# Patient Record
Sex: Female | Born: 1995 | Race: White | Hispanic: No | Marital: Single
Health system: Southern US, Community
[De-identification: ages and names within clinical notes are randomized; demographics above are authoritative.]

---

## 2020-07-18 ENCOUNTER — Emergency Department (HOSPITAL_COMMUNITY)
Admission: EM | Admit: 2020-07-18 | Discharge: 2020-07-18 | Disposition: A | Discharge status: Home / Self Care | Payer: BC Managed Care – PPO | Attending: Emergency Medicine | Admitting: Emergency Medicine

## 2020-07-18 ENCOUNTER — Emergency Department (HOSPITAL_COMMUNITY): Payer: BC Managed Care – PPO

## 2020-07-18 ENCOUNTER — Other Ambulatory Visit: Payer: Self-pay

## 2020-07-18 DIAGNOSIS — Y9301 Activity, walking, marching and hiking: Secondary | ICD-10-CM | POA: Insufficient documentation

## 2020-07-18 DIAGNOSIS — S99912A Unspecified injury of left ankle, initial encounter: Secondary | ICD-10-CM | POA: Diagnosis present

## 2020-07-18 DIAGNOSIS — S8262XA Displaced fracture of lateral malleolus of left fibula, initial encounter for closed fracture: Secondary | ICD-10-CM | POA: Diagnosis not present

## 2020-07-18 DIAGNOSIS — S80211A Abrasion, right knee, initial encounter: Secondary | ICD-10-CM | POA: Insufficient documentation

## 2020-07-18 DIAGNOSIS — X501XXA Overexertion from prolonged static or awkward postures, initial encounter: Secondary | ICD-10-CM | POA: Diagnosis not present

## 2020-07-18 DIAGNOSIS — Y92219 Unspecified school as the place of occurrence of the external cause: Secondary | ICD-10-CM | POA: Diagnosis not present

## 2020-07-18 NOTE — ED Provider Notes (Signed)
[facility] EMERGENCY DEPARTMENT Provider Note   CSN: [id number] Arrival date & time: 07/18/20  1252     History Chief Complaint  Patient presents with  . Ankle Pain    Sarah Dickson is a 25 y.o. female presents today for evaluation of left ankle injury.  Patient was walking to class today down a slight hill when she rolled her ankle, inversion injury.  Patient reports pain began immediately after her injury, moderate intensity aching nonradiating worsened with ambulation improved with rest.  Patient was able to walk to her class to take her exam and return home she then grabbed some crutches and came to the ER for evaluation.  Patient has noticed swelling and bruising of her left lateral ankle since that time.  Denies blood thinner use, head injury, loss conscious, fever/chills, numbness/tingling, weakness or any additional concerns.  HPI     No past medical history on file.  There are no problems to display for this patient.      OB History   No obstetric history on file.     No family history on file.     Home Medications Prior to Admission medications   Not on File    Allergies    Patient has no allergy information on record.  Review of Systems   Review of Systems  Constitutional: Negative.  Negative for chills and fever.  Cardiovascular: Negative.  Negative for chest pain.  Musculoskeletal: Positive for arthralgias and joint swelling.  Skin: Negative.  Negative for wound.  Neurological: Negative.  Negative for syncope, weakness and numbness.    Physical Exam Updated Vital Signs BP 121/80 (BP Location: Left Arm)   Pulse (!) 102   Temp 98.3 F (36.8 C) (Oral)   Resp 18   Ht 5\' 8"  (1.727 m)   Wt 90.7 kg   LMP 06/23/2020   SpO2 98%   BMI 30.41 kg/m   Physical Exam Constitutional:      General: She is not in acute distress.    Appearance: Normal appearance. She is well-developed. She is not ill-appearing or diaphoretic.   HENT:     Head: Normocephalic and atraumatic.  Eyes:     General: Vision grossly intact. Gaze aligned appropriately.     Pupils: Pupils are equal, round, and reactive to light.  Neck:     Trachea: Trachea and phonation normal.  Cardiovascular:     Pulses:          Dorsalis pedis pulses are 2+ on the right side and 2+ on the left side.  Pulmonary:     Effort: Pulmonary effort is normal. No respiratory distress.  Abdominal:     General: There is no distension.     Palpations: Abdomen is soft.     Tenderness: There is no abdominal tenderness. There is no guarding or rebound.  Musculoskeletal:        General: Normal range of motion.     Cervical back: Normal range of motion.       Feet:     Comments: Full range of motion appropriate strength without pain of the right hip, right knee and right ankle.  Capillary refill and sensation intact to all toes.  Strong equal pedal pulses.  No tibial plateau tenderness to palpation.  Compartments soft in the right leg.  Feet:     Right foot:     Protective Sensation: 5 sites tested. 5 sites sensed.     Skin integrity: Skin integrity normal.  Left foot:     Protective Sensation: 5 sites tested. 5 sites sensed.     Skin integrity: Skin integrity normal.     Comments: Swelling and bruising of the left lateral malleolus.  No skin break.  No tenting.  Dorsiflexion plantarflexion intact with some increased pain.  Increased pain with eversion inversion as well.  Resisted range of motion with all movements is intact with good strength.  Capillary refill and sensation tact to all toes.  Strong equal pedal pulses.  Compartments are soft.  No pain with range of motion at the toes, knee or hip. Skin:    General: Skin is warm and dry.          Comments: 2 cm abrasion overlying the right patella without tenderness.  Neurological:     Mental Status: She is alert.     GCS: GCS eye subscore is 4. GCS verbal subscore is 5. GCS motor subscore is 6.      Comments: Speech is clear and goal oriented, follows commands Major Cranial nerves without deficit, no facial droop Moves extremities without ataxia, coordination intact  Psychiatric:        Behavior: Behavior normal.     ED Results / Procedures / Treatments   Labs (all labs ordered are listed, but only abnormal results are displayed) Labs Reviewed - No data to display  EKG None  Radiology No results found.  Procedures Procedures   Medications Ordered in ED Medications - No data to display  ED Course  I have reviewed the triage vital signs and the nursing notes.  Pertinent labs & imaging results that were available during my care of the patient were reviewed by me and considered in my medical decision making (see chart for details).  Clinical Course as of 07/18/20 1509  Tue Jul 18, 2020  1504 CLINICAL DATA: Lateral malleolar pain after fall.  EXAM: LEFT ANKLE COMPLETE - 3+ VIEW  COMPARISON: None  FINDINGS: Small bone fragment seen inferior to the LEFT lateral malleolus. Extensive soft tissue swelling in this location.  Ankle mortise is preserved.  No additional fractures are identified. No significant degenerative change  IMPRESSION: Lateral malleolar avulsion fracture with overlying soft tissue swelling.   Electronically Signed By: Donzetta Kohut M.D. On: 07/18/2020 14:42 [BM]    Clinical Course User Index [BM] Elizabeth Palau   MDM Rules/Calculators/A&P                         Additional history obtained from: 1. Nursing notes from this visit. - 25 year old female presents today for evaluation of left ankle pain swelling and bruising after an inversion injury that occurred earlier this morning.  She has been able to ambulate on the ankle with some increased pain.  Patient is neurovascular intact to the extremity.  Physical examination concerning for ligamentous injury possibly of the ATFL, some tenderness to palpation overlying the lateral  malleolus.  Will obtain x-ray of the left ankle to assess for osseous injury.  Additionally patient does have an abrasion to the right knee as well, she reports her Tdap is up-to-date.  She has full range of motion appropriate strength of the right knee without pain, she declined x-ray of the right knee. --------- DG Left Ankle: IMPRESSION: Lateral malleolar avulsion fracture with overlying soft tissue swelling.  Patient updated on x-ray findings states understanding, patient placed in cam walker boot, encouraged crutches and nonweightbearing until she follows up with orthopedist.  On-call  orthopedist Dr. Magdalene Patricia information was given to the patient patient courage to call their office today to schedule follow-up appointment.  RICE therapy and OTC anti-inflammatory use discussed.  No evidence for compartment syndrome, fracture/dislocation, neurovascular compromise, cellulitis, DVT, septic arthritis or other emergent pathologies as to patient's right knee abrasion, no indication for repair, superficial abrasion.  Suspicion for fracture/dislocation of that area, patient refused x-rays of the right knee which I feel is reasonable today.  Wound care precautions discussed and patient stated understanding.  Tdap up-to-date per patient  At this time there does not appear to be any evidence of an acute emergency medical condition and the patient appears stable for discharge with appropriate outpatient follow up. Diagnosis was discussed with patient who verbalizes understanding of care plan and is agreeable to discharge. I have discussed return precautions with patient who verbalizes understanding. Patient encouraged to follow-up with their PCP and orthopedist. All questions answered.   Note: Portions of this report may have been transcribed using voice recognition software. Every effort was made to ensure accuracy; however, inadvertent computerized transcription errors may still be present. Final Clinical  Impression(s) / ED Diagnoses Final diagnoses:  Abrasion, right knee, initial encounter  Closed avulsion fracture of lateral malleolus of left fibula, initial encounter    Rx / DC Orders ED Discharge Orders    None       Elizabeth Palau 07/18/20 1510    Cathren Laine, MD 07/20/20 1559

## 2020-07-18 NOTE — ED Triage Notes (Signed)
Pt presents to the ED with left ankle pain and swelling after tripping and rolling her ankle. Denies head strike or LOC. Pedal pulses strong and equal. Bruising assessed to left ankle.

## 2020-07-18 NOTE — Progress Notes (Signed)
Orthopedic Tech Progress Note Patient Details:  Sarah Dickson [birth year] [id number] Patient had her own CRUTCHES  Ortho Devices Type of Ortho Device: CAM walker Ortho Device/Splint Location: LLE Ortho Device/Splint Interventions: Ordered,Application   Post Interventions Patient Tolerated: Well Instructions Provided: Care of device   Sarah Dickson 07/18/2020, 4:38 PM

## 2020-07-18 NOTE — Discharge Instructions (Addendum)
At this time there does not appear to be the presence of an emergent medical condition, however there is always the potential for conditions to change. Please read and follow the below instructions.  Please return to the Emergency Department immediately for any new or worsening symptoms. Please be sure to follow up with your Primary Care Provider within one week regarding your visit today; please call their office to schedule an appointment even if you are feeling better for a follow-up visit. Please call the orthopedic specialist Dr. Carola Frost on your discharge paperwork for follow-up appointment regarding your left ankle injury.  Please use rest ice elevation to help with pain and swelling.  You may use over-the-counter anti-inflammatory such as Tylenol and ibuprofen to help with your symptoms.  Please use the cam walker to protect your ankle from further injury, please use the crutches to avoid placing weight on your left ankle until you follow-up with the orthopedic specialist. Please keep the abrasion on your right knee clean, wash gently twice a day with clean soapy water.  Apply small amount of antibiotic ointment there for the next 3 days and keep covered with a sterile bandage.  If you develop any signs of infection such as redness, drainage, fever, increased pain, swelling or discharge return immediately to the emergency department.  Go to the nearest Emergency Department immediately if: You have fever or chills You cannot feel your toes or foot. Your foot or toes look blue. You have very bad pain that gets worse. You develop severe pain or more swelling in your ankle, leg, or foot that cannot be controlled with medicines. Your skin or nails below the injury turn blue or gray, feel cold, or become numb. The skin under your cast burns or stings. There is a bad smell or pus coming from under the cast. You cannot move your toes. You have any new/concerning or worsening of symptoms  Please read  the additional information packets attached to your discharge summary.  Do not take your medicine if  develop an itchy rash, swelling in your mouth or lips, or difficulty breathing; call 911 and seek immediate emergency medical attention if this occurs.  You may review your lab tests and imaging results in their entirety on your MyChart account.  Please discuss all results of fully with your primary care provider and other specialist at your follow-up visit.  Note: Portions of this text may have been transcribed using voice recognition software. Every effort was made to ensure accuracy; however, inadvertent computerized transcription errors may still be present.

## 2021-10-20 IMAGING — DX DG ANKLE COMPLETE 3+V*L*
3 series · 3 of 3 positions shown · non-contrast
Comparison: None

CLINICAL DATA: Lateral malleolar pain after fall.

EXAM:
LEFT ANKLE COMPLETE - 3+ VIEW

[ankle ap]
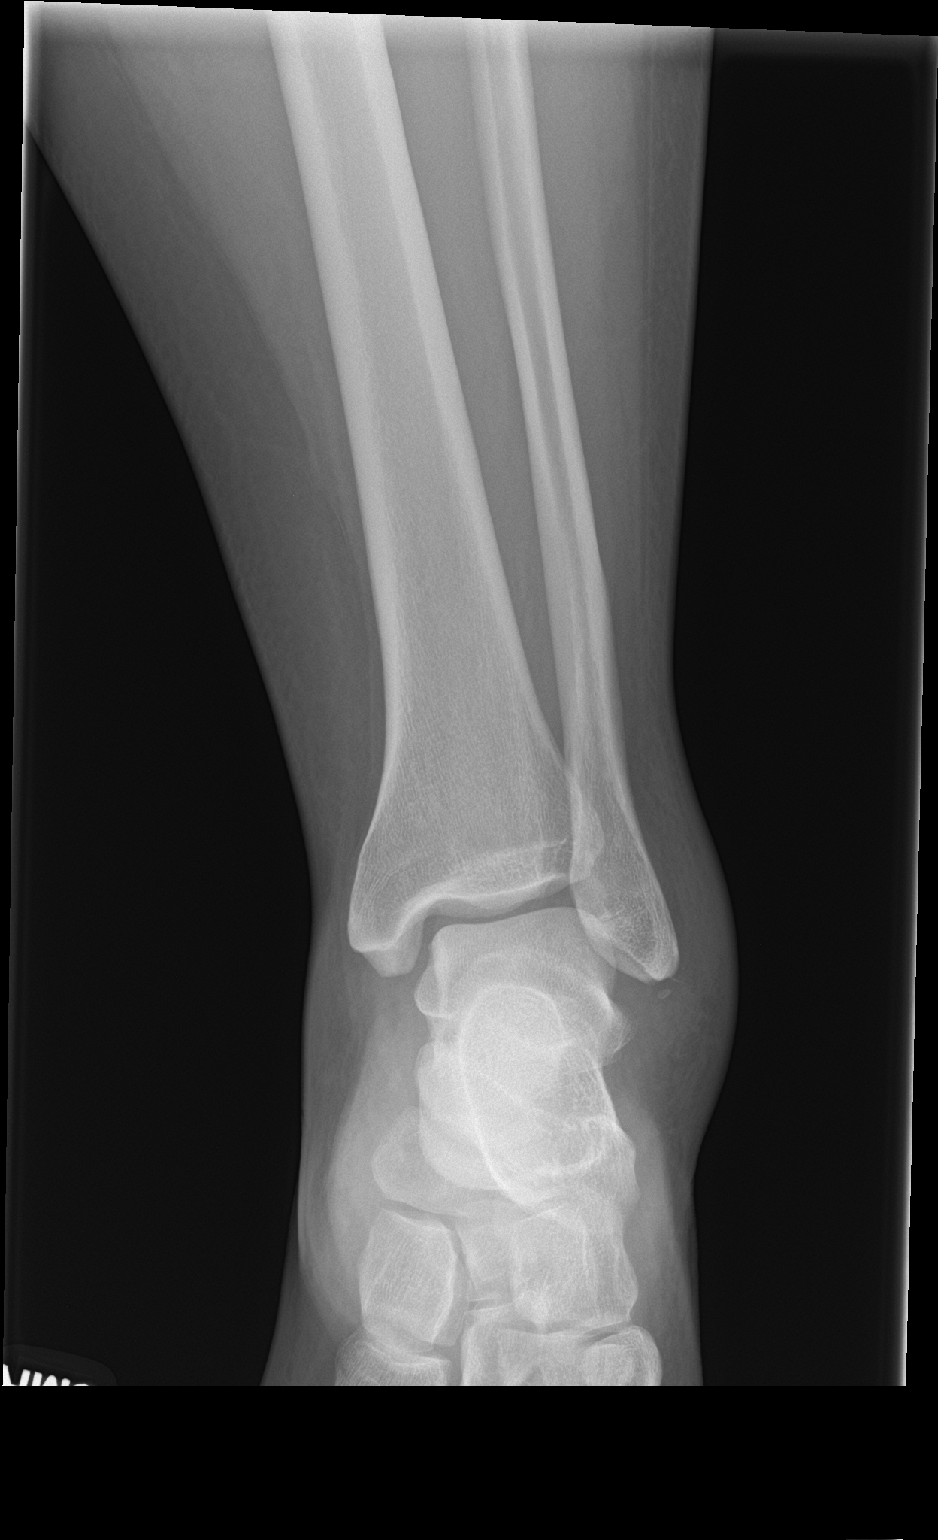

[ankle obl]
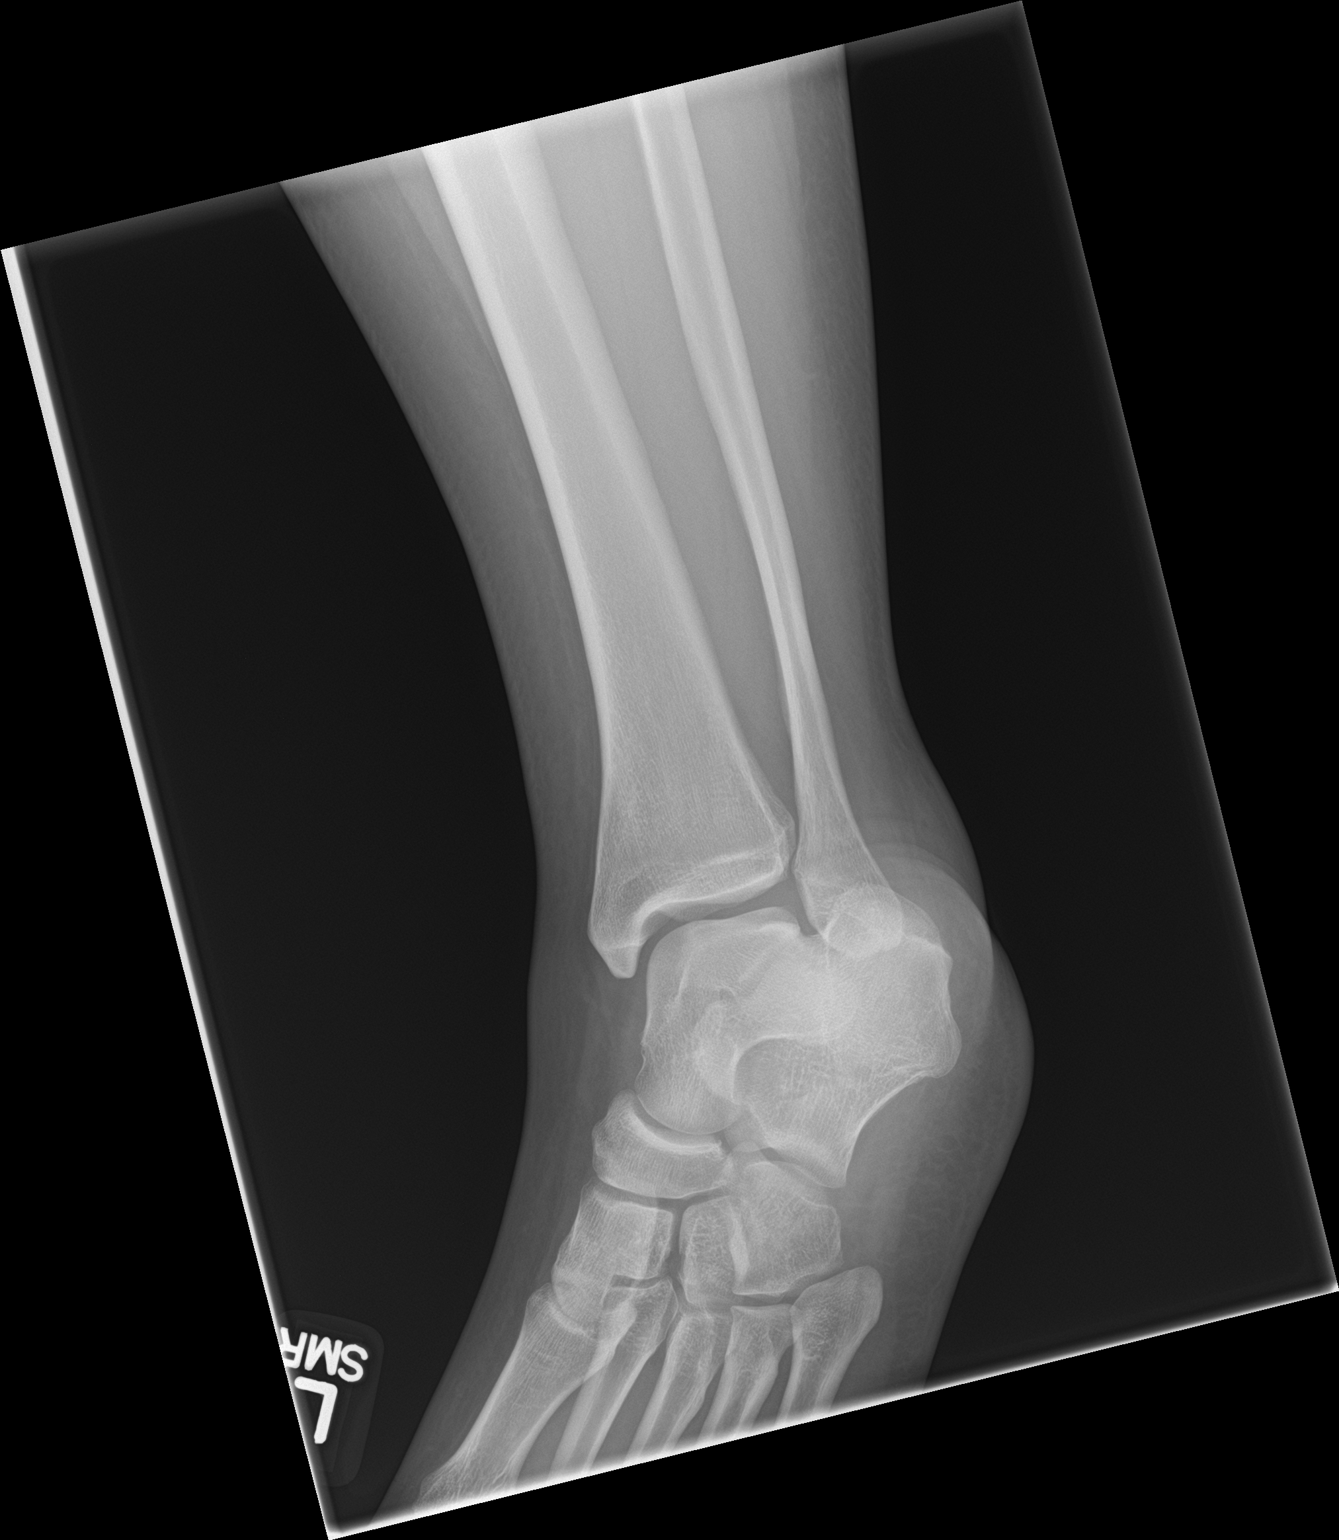

[ankle lat]
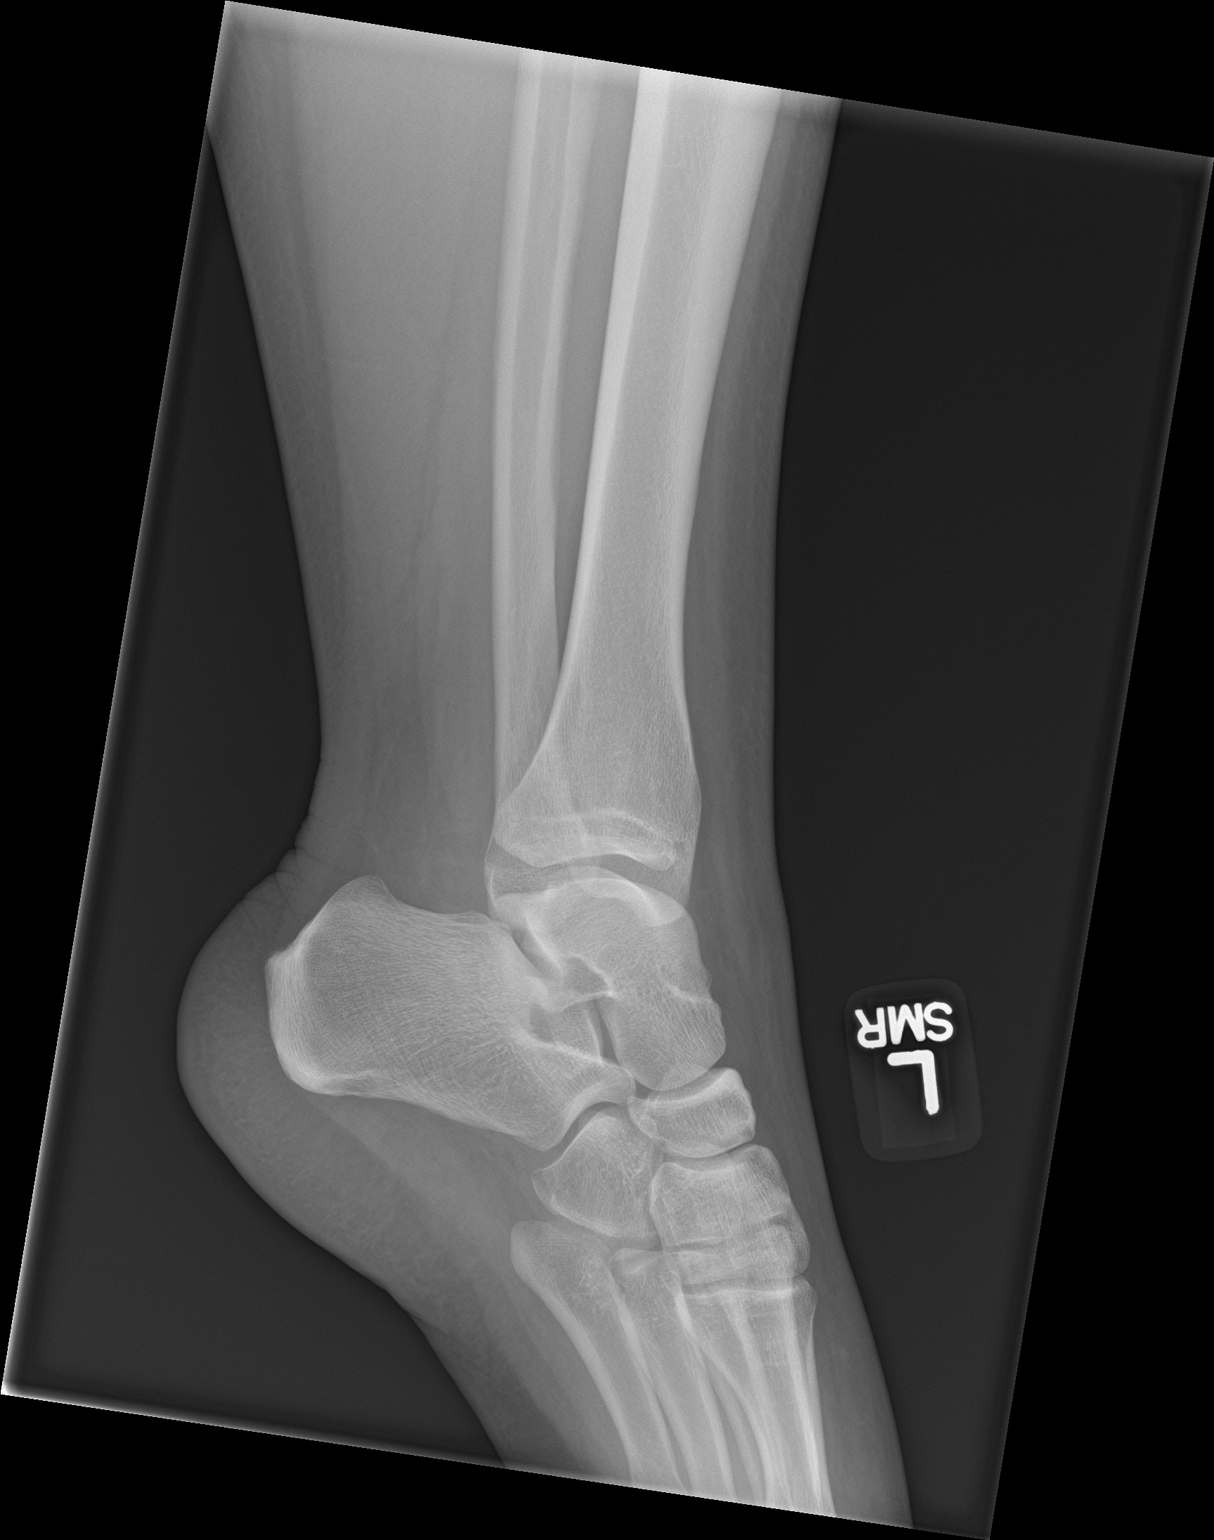

[3 of 3 positions shown; findings below may reference images not displayed]

FINDINGS: Small bone fragment seen inferior to the LEFT lateral malleolus.
Extensive soft tissue swelling in this location.

Ankle mortise is preserved.

No additional fractures are identified. No significant degenerative
change
IMPRESSION: Lateral malleolar avulsion fracture with overlying soft tissue
swelling.
